# Patient Record
Sex: Female | Born: 1955 | Race: White | Hispanic: No | Smoking: Current some day smoker
Health system: Southern US, Community
[De-identification: ages and names within clinical notes are randomized; demographics above are authoritative.]

## PROBLEM LIST (undated history)

## (undated) DIAGNOSIS — F419 Anxiety disorder, unspecified: Secondary | ICD-10-CM

## (undated) DIAGNOSIS — R011 Cardiac murmur, unspecified: Secondary | ICD-10-CM

## (undated) DIAGNOSIS — J449 Chronic obstructive pulmonary disease, unspecified: Secondary | ICD-10-CM

## (undated) DIAGNOSIS — I739 Peripheral vascular disease, unspecified: Secondary | ICD-10-CM

## (undated) DIAGNOSIS — IMO0001 Reserved for inherently not codable concepts without codable children: Secondary | ICD-10-CM

## (undated) DIAGNOSIS — T7840XA Allergy, unspecified, initial encounter: Secondary | ICD-10-CM

## (undated) DIAGNOSIS — I10 Essential (primary) hypertension: Secondary | ICD-10-CM

## (undated) HISTORY — PX: CHOLECYSTECTOMY: SHX55

## (undated) HISTORY — PX: BREAST BIOPSY: SHX20

## (undated) HISTORY — DX: Allergy, unspecified, initial encounter: T78.40XA

## (undated) HISTORY — PX: TUBAL LIGATION: SHX77

## (undated) HISTORY — DX: Essential (primary) hypertension: I10

## (undated) HISTORY — DX: Chronic obstructive pulmonary disease, unspecified: J44.9

## (undated) HISTORY — PX: TONSILLECTOMY: SUR1361

---

## 1988-08-07 HISTORY — PX: ABDOMINAL HYSTERECTOMY: SHX81

## 2009-08-16 ENCOUNTER — Ambulatory Visit: Payer: Self-pay | Admitting: Gastroenterology

## 2009-10-08 ENCOUNTER — Ambulatory Visit: Payer: Self-pay | Admitting: Family Medicine

## 2011-09-18 ENCOUNTER — Ambulatory Visit: Payer: Self-pay | Admitting: Family Medicine

## 2012-11-13 ENCOUNTER — Ambulatory Visit: Payer: Self-pay | Admitting: Family Medicine

## 2013-11-14 ENCOUNTER — Ambulatory Visit: Payer: Self-pay | Admitting: Internal Medicine

## 2013-11-14 LAB — CBC CANCER CENTER
BASOS ABS: 0.1 x10 3/mm (ref 0.0–0.1)
BASOS PCT: 0.6 %
EOS ABS: 0.1 x10 3/mm (ref 0.0–0.7)
EOS PCT: 0.4 %
HCT: 56.5 % — ABNORMAL HIGH (ref 35.0–47.0)
HGB: 18.6 g/dL — ABNORMAL HIGH (ref 12.0–16.0)
Lymphocyte #: 2.3 x10 3/mm (ref 1.0–3.6)
Lymphocyte %: 16.5 %
MCH: 33.2 pg (ref 26.0–34.0)
MCHC: 33 g/dL (ref 32.0–36.0)
MCV: 101 fL — ABNORMAL HIGH (ref 80–100)
Monocyte #: 1.1 x10 3/mm — ABNORMAL HIGH (ref 0.2–0.9)
Monocyte %: 8.1 %
Neutrophil #: 10.3 x10 3/mm — ABNORMAL HIGH (ref 1.4–6.5)
Neutrophil %: 74.4 %
PLATELETS: 258 x10 3/mm (ref 150–440)
RBC: 5.61 10*6/uL — ABNORMAL HIGH (ref 3.80–5.20)
RDW: 12.9 % (ref 11.5–14.5)
WBC: 13.8 x10 3/mm — ABNORMAL HIGH (ref 3.6–11.0)

## 2013-11-14 LAB — IRON AND TIBC
IRON: 59 ug/dL (ref 50–170)
Iron Bind.Cap.(Total): 399 ug/dL (ref 250–450)
Iron Saturation: 15 %
UNBOUND IRON-BIND. CAP.: 340 ug/dL

## 2013-11-14 LAB — FERRITIN: Ferritin (ARMC): 49 ng/mL (ref 8–388)

## 2013-11-17 LAB — CANCER CENTER HEMATOCRIT: HCT: 47.8 % — ABNORMAL HIGH (ref 35.0–47.0)

## 2013-11-19 ENCOUNTER — Ambulatory Visit: Payer: Self-pay | Admitting: Internal Medicine

## 2013-11-21 LAB — CANCER CENTER HEMATOCRIT: HCT: 42.6 % (ref 35.0–47.0)

## 2013-12-05 ENCOUNTER — Ambulatory Visit: Payer: Self-pay | Admitting: Internal Medicine

## 2013-12-05 LAB — CANCER CENTER HEMATOCRIT: HCT: 43.9 % (ref 35.0–47.0)

## 2013-12-19 LAB — CANCER CENTER HEMATOCRIT: HCT: 42.2 % (ref 35.0–47.0)

## 2014-01-02 LAB — CANCER CENTER HEMATOCRIT: HCT: 44.5 % (ref 35.0–47.0)

## 2014-01-05 ENCOUNTER — Ambulatory Visit: Payer: Self-pay | Admitting: Internal Medicine

## 2014-01-16 LAB — CANCER CENTER HEMATOCRIT: HCT: 39.7 % (ref 35.0–47.0)

## 2014-01-30 LAB — CANCER CENTER HEMATOCRIT: HCT: 41.1 % (ref 35.0–47.0)

## 2014-02-04 ENCOUNTER — Ambulatory Visit: Payer: Self-pay | Admitting: Internal Medicine

## 2014-02-13 LAB — CBC CANCER CENTER
BASOS ABS: 0.1 x10 3/mm (ref 0.0–0.1)
Basophil %: 1 %
EOS PCT: 2.5 %
Eosinophil #: 0.2 x10 3/mm (ref 0.0–0.7)
HCT: 41.4 % (ref 35.0–47.0)
HGB: 13.7 g/dL (ref 12.0–16.0)
Lymphocyte #: 1.7 x10 3/mm (ref 1.0–3.6)
Lymphocyte %: 20.3 %
MCH: 29.1 pg (ref 26.0–34.0)
MCHC: 33 g/dL (ref 32.0–36.0)
MCV: 88 fL (ref 80–100)
Monocyte #: 0.8 x10 3/mm (ref 0.2–0.9)
Monocyte %: 8.7 %
Neutrophil #: 5.8 x10 3/mm (ref 1.4–6.5)
Neutrophil %: 67.5 %
PLATELETS: 412 x10 3/mm (ref 150–440)
RBC: 4.69 10*6/uL (ref 3.80–5.20)
RDW: 15.4 % — ABNORMAL HIGH (ref 11.5–14.5)
WBC: 8.6 x10 3/mm (ref 3.6–11.0)

## 2014-03-07 ENCOUNTER — Ambulatory Visit: Payer: Self-pay | Admitting: Internal Medicine

## 2014-03-27 LAB — CANCER CENTER HEMATOCRIT: HCT: 42.9 % (ref 35.0–47.0)

## 2014-04-07 ENCOUNTER — Ambulatory Visit: Payer: Self-pay | Admitting: Internal Medicine

## 2014-05-08 ENCOUNTER — Ambulatory Visit: Payer: Self-pay | Admitting: Internal Medicine

## 2014-05-08 LAB — CANCER CENTER HEMATOCRIT: HCT: 44.6 % (ref 35.0–47.0)

## 2014-06-07 ENCOUNTER — Ambulatory Visit: Payer: Self-pay | Admitting: Internal Medicine

## 2014-06-19 LAB — CANCER CENTER HEMATOCRIT: HCT: 40.4 % (ref 35.0–47.0)

## 2014-07-07 ENCOUNTER — Ambulatory Visit: Payer: Self-pay | Admitting: Internal Medicine

## 2014-07-24 LAB — CANCER CENTER HEMATOCRIT: HCT: 42.1 % (ref 35.0–47.0)

## 2014-08-07 ENCOUNTER — Ambulatory Visit: Payer: Self-pay | Admitting: Internal Medicine

## 2014-09-04 LAB — CANCER CENTER HEMATOCRIT: HCT: 43.3 % (ref 35.0–47.0)

## 2014-09-07 ENCOUNTER — Ambulatory Visit: Payer: Self-pay | Admitting: Internal Medicine

## 2014-10-26 ENCOUNTER — Ambulatory Visit
Admit: 2014-10-26 | Disposition: A | Discharge status: Home / Self Care | Payer: Self-pay | Attending: Internal Medicine | Admitting: Internal Medicine

## 2014-10-29 ENCOUNTER — Ambulatory Visit: Payer: Self-pay | Admitting: Family Medicine

## 2014-11-06 ENCOUNTER — Ambulatory Visit
Admit: 2014-11-06 | Disposition: A | Discharge status: Home / Self Care | Payer: Self-pay | Attending: Internal Medicine | Admitting: Internal Medicine

## 2014-12-21 ENCOUNTER — Inpatient Hospital Stay: Payer: BC Managed Care – PPO | Attending: Internal Medicine

## 2014-12-21 ENCOUNTER — Inpatient Hospital Stay: Payer: BC Managed Care – PPO

## 2014-12-21 ENCOUNTER — Other Ambulatory Visit: Payer: Self-pay

## 2014-12-21 DIAGNOSIS — D751 Secondary polycythemia: Secondary | ICD-10-CM

## 2014-12-21 LAB — CBC WITH DIFFERENTIAL/PLATELET
Basophils Absolute: 0.1 10*3/uL (ref 0–0.1)
Basophils Relative: 1 %
Eosinophils Absolute: 0.2 10*3/uL (ref 0–0.7)
Eosinophils Relative: 3 %
HEMATOCRIT: 43.7 % (ref 35.0–47.0)
Hemoglobin: 14.5 g/dL (ref 12.0–16.0)
LYMPHS ABS: 1.8 10*3/uL (ref 1.0–3.6)
LYMPHS PCT: 19 %
MCH: 28.8 pg (ref 26.0–34.0)
MCHC: 33.1 g/dL (ref 32.0–36.0)
MCV: 87.1 fL (ref 80.0–100.0)
MONO ABS: 0.8 10*3/uL (ref 0.2–0.9)
Monocytes Relative: 9 %
Neutro Abs: 6.7 10*3/uL — ABNORMAL HIGH (ref 1.4–6.5)
Neutrophils Relative %: 68 %
Platelets: 306 10*3/uL (ref 150–440)
RBC: 5.02 MIL/uL (ref 3.80–5.20)
RDW: 18.4 % — ABNORMAL HIGH (ref 11.5–14.5)
WBC: 9.7 10*3/uL (ref 3.6–11.0)

## 2015-02-12 ENCOUNTER — Other Ambulatory Visit: Payer: Self-pay

## 2015-02-12 DIAGNOSIS — D751 Secondary polycythemia: Secondary | ICD-10-CM | POA: Insufficient documentation

## 2015-02-13 ENCOUNTER — Other Ambulatory Visit: Payer: Self-pay

## 2015-02-15 ENCOUNTER — Inpatient Hospital Stay: Payer: BC Managed Care – PPO

## 2015-02-15 ENCOUNTER — Inpatient Hospital Stay: Payer: BC Managed Care – PPO | Attending: Internal Medicine

## 2015-02-15 DIAGNOSIS — D751 Secondary polycythemia: Secondary | ICD-10-CM | POA: Insufficient documentation

## 2015-02-15 LAB — HEMATOCRIT: HEMATOCRIT: 41.4 % (ref 35.0–47.0)

## 2015-04-13 ENCOUNTER — Inpatient Hospital Stay: Payer: BC Managed Care – PPO

## 2015-04-13 ENCOUNTER — Inpatient Hospital Stay: Payer: BC Managed Care – PPO | Attending: Internal Medicine

## 2015-06-06 ENCOUNTER — Other Ambulatory Visit: Payer: Self-pay | Admitting: *Deleted

## 2015-06-06 DIAGNOSIS — D751 Secondary polycythemia: Secondary | ICD-10-CM

## 2015-06-07 ENCOUNTER — Inpatient Hospital Stay: Payer: BC Managed Care – PPO | Attending: Internal Medicine

## 2015-06-07 ENCOUNTER — Inpatient Hospital Stay: Payer: BC Managed Care – PPO

## 2015-06-07 DIAGNOSIS — D751 Secondary polycythemia: Secondary | ICD-10-CM | POA: Insufficient documentation

## 2015-06-07 LAB — HEMATOCRIT: HEMATOCRIT: 45.9 % (ref 35.0–47.0)

## 2015-08-02 ENCOUNTER — Other Ambulatory Visit: Payer: Self-pay

## 2015-08-04 ENCOUNTER — Inpatient Hospital Stay: Payer: BC Managed Care – PPO | Attending: Internal Medicine

## 2015-08-04 ENCOUNTER — Inpatient Hospital Stay: Payer: BC Managed Care – PPO

## 2015-08-04 VITALS — BP 150/71 | HR 90 | Temp 97.0°F | Resp 20

## 2015-08-04 DIAGNOSIS — D751 Secondary polycythemia: Secondary | ICD-10-CM | POA: Insufficient documentation

## 2015-08-04 LAB — HEMATOCRIT: HCT: 44 % (ref 35.0–47.0)

## 2015-09-14 ENCOUNTER — Other Ambulatory Visit: Payer: Self-pay | Admitting: Family Medicine

## 2015-09-14 DIAGNOSIS — Z1231 Encounter for screening mammogram for malignant neoplasm of breast: Secondary | ICD-10-CM

## 2015-09-22 ENCOUNTER — Ambulatory Visit
Admission: RE | Admit: 2015-09-22 | Discharge: 2015-09-22 | Disposition: A | Discharge status: Home / Self Care | Payer: BC Managed Care – PPO | Source: Ambulatory Visit | Attending: Family Medicine | Admitting: Family Medicine

## 2015-09-22 DIAGNOSIS — Z1231 Encounter for screening mammogram for malignant neoplasm of breast: Secondary | ICD-10-CM

## 2015-09-27 ENCOUNTER — Encounter: Payer: Self-pay | Admitting: Internal Medicine

## 2015-09-27 ENCOUNTER — Ambulatory Visit: Payer: Self-pay | Admitting: Internal Medicine

## 2015-09-27 ENCOUNTER — Inpatient Hospital Stay: Payer: BC Managed Care – PPO | Attending: Internal Medicine | Admitting: Internal Medicine

## 2015-09-27 ENCOUNTER — Inpatient Hospital Stay: Payer: BC Managed Care – PPO

## 2015-09-27 VITALS — BP 155/77 | HR 88 | Temp 98.1°F | Resp 18 | Ht 59.0 in | Wt 116.8 lb

## 2015-09-27 DIAGNOSIS — D751 Secondary polycythemia: Secondary | ICD-10-CM | POA: Diagnosis not present

## 2015-09-27 DIAGNOSIS — I10 Essential (primary) hypertension: Secondary | ICD-10-CM | POA: Diagnosis not present

## 2015-09-27 DIAGNOSIS — Z79899 Other long term (current) drug therapy: Secondary | ICD-10-CM | POA: Insufficient documentation

## 2015-09-27 DIAGNOSIS — Z803 Family history of malignant neoplasm of breast: Secondary | ICD-10-CM | POA: Insufficient documentation

## 2015-09-27 DIAGNOSIS — Z7982 Long term (current) use of aspirin: Secondary | ICD-10-CM | POA: Diagnosis not present

## 2015-09-27 DIAGNOSIS — F17210 Nicotine dependence, cigarettes, uncomplicated: Secondary | ICD-10-CM | POA: Insufficient documentation

## 2015-09-27 DIAGNOSIS — J449 Chronic obstructive pulmonary disease, unspecified: Secondary | ICD-10-CM | POA: Diagnosis not present

## 2015-09-27 LAB — CBC WITH DIFFERENTIAL/PLATELET
Basophils Absolute: 0.1 10*3/uL (ref 0–0.1)
Basophils Relative: 1 %
EOS PCT: 1 %
Eosinophils Absolute: 0.1 10*3/uL (ref 0–0.7)
HEMATOCRIT: 43.8 % (ref 35.0–47.0)
Hemoglobin: 15 g/dL (ref 12.0–16.0)
LYMPHS PCT: 19 %
Lymphs Abs: 1.7 10*3/uL (ref 1.0–3.6)
MCH: 30 pg (ref 26.0–34.0)
MCHC: 34.2 g/dL (ref 32.0–36.0)
MCV: 87.8 fL (ref 80.0–100.0)
MONO ABS: 1 10*3/uL — AB (ref 0.2–0.9)
MONOS PCT: 11 %
NEUTROS ABS: 6.2 10*3/uL (ref 1.4–6.5)
Neutrophils Relative %: 68 %
PLATELETS: 292 10*3/uL (ref 150–440)
RBC: 4.99 MIL/uL (ref 3.80–5.20)
RDW: 17.5 % — AB (ref 11.5–14.5)
WBC: 9 10*3/uL (ref 3.6–11.0)

## 2015-09-27 NOTE — Progress Notes (Signed)
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 S/p Phlebotomy [2015]  # Smoking/ COPD  INTERVAL HISTORY:  This is my first interaction with the patient since I joined the practice September 2016. I reviewed the patient's prior charts/pertinent labs in detail; findings are summarized above.   Patient has just retired from [employer]. She is planned to quit smoking she has just started on Wellbutrin. She is chronic shortness of breath or chronic cough and no hemoptysis.  She denies any headaches or vision changes. No new strokes.   REVIEW OF SYSTEMS:  A complete 10 point review of system is done which is negative except mentioned above/history of present illness.   PAST MEDICAL HISTORY :  Past Medical History  Diagnosis Date  . COPD (chronic obstructive pulmonary disease) (HCC)   . Hypertension   . Allergy     PAST SURGICAL HISTORY :   Past Surgical History  Procedure Laterality Date  . Breast biopsy Left     neg  . Abdominal hysterectomy  1990    FAMILY HISTORY :   Family History  Problem Relation Age of Onset  . Breast cancer Mother 48  . Stroke Father     SOCIAL HISTORY:   Social History  Substance Use Topics  . Smoking status: Current Some Day Smoker -- 0.50 packs/day for 30 years    Types: Cigarettes  . Smokeless tobacco: None  . Alcohol Use: 4.2 oz/week    7 Glasses of wine per week    ALLERGIES:  has No Known Allergies.  MEDICATIONS:  Current Outpatient Prescriptions  Medication Sig Dispense Refill  . atorvastatin (LIPITOR) 40 MG tablet Take by mouth.    Marland Kitchen buPROPion (WELLBUTRIN XL) 150 MG 24 hr tablet Take by mouth.    Marland Kitchen aspirin 81 MG EC tablet     . enalapril (VASOTEC) 2.5 MG tablet TAKE 1 TABLET (2.5 MG TOTAL) BY MOUTH ONCE DAILY.  99  . hydrochlorothiazide (HYDRODIURIL) 25 MG tablet TAKE 1  TABLET (25 MG TOTAL) BY MOUTH ONCE DAILY.  99  . PROAIR HFA 108 (90 Base) MCG/ACT inhaler 2 puffs every 4 (four) hours as needed. for wheezing  11  . SPIRIVA HANDIHALER 18 MCG inhalation capsule      No current facility-administered medications for this visit.    PHYSICAL EXAMINATION:   BP 155/77 mmHg  Pulse 88  Temp(Src) 98.1 F (36.7 C) (Tympanic)  Resp 18  Ht  (1.499 m)  Wt 116 lb 13.5 oz (53 kg)  BMI 23.59 kg/m2  Filed Weights   09/27/15 1342  Weight: 116 lb 13.5 oz (53 kg)    GENERAL:moderately built moderately nourished.Alert, no distress and comfortable. Smells nicotine. EYES: no pallor or icterus OROPHARYNX: no thrush or ulceration; poor dentition.  NECK: supple, no masses felt LYMPH:  no palpable lymphadenopathy in the cervical, axillary or inguinal regions LUNGS: decreased air entry;   No wheeze or crackles HEART/CVS: regular rate & rhythm and no murmurs; No lower extremity edema ABDOMEN:abdomen soft, non-tender and normal bowel sounds Musculoskeletal:no cyanosis of digits and no clubbing  PSYCH: alert & oriented x 3 with fluent speech NEURO: no focal motor/sensory deficits SKIN:  no rashes or significant lesions  LABORATORY DATA:   No results found for: SPEP, UPEP  Lab Results  Component Value Date   WBC 9.0 09/27/2015  NEUTROABS 6.2 09/27/2015   HGB 15.0 09/27/2015   HCT 43.8 09/27/2015   MCV 87.8 09/27/2015   PLT 292 09/27/2015      Chemistry   No results found for: NA, K, CL, CO2, BUN, CREATININE, GLU No results found for: CALCIUM, ALKPHOS, AST, ALT, BILITOT      ASSESSMENT & PLAN:   # secondary 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 Hematocrit today is 43.8 hemoglobin is 15. Patient will continue aspirin once a day. Phlebotomy if HCT > 50/or if patient is symptomatic.   # chronic smoker discussed about quitting smoking; she is just started Wellbutrin.   # lung cancer screening- this has been discussed by her primary care  physician; she is interested/ she is checking with her insurance- she'll follow-up with her PCP.  # patient follow-up with [place] in one year/CBC/possible phlebotomy at that time.  # 15 minutes face-to-face with the patient discussing the above plan of care; more than 50% of time spent on natural history; counseling and coordination.     Earna Coder, MD 09/27/2015 1:54 PM

## 2015-09-29 ENCOUNTER — Other Ambulatory Visit: Payer: Self-pay | Admitting: Vascular Surgery

## 2015-09-29 DIAGNOSIS — I6523 Occlusion and stenosis of bilateral carotid arteries: Secondary | ICD-10-CM

## 2015-10-01 ENCOUNTER — Ambulatory Visit
Admission: RE | Admit: 2015-10-01 | Discharge: 2015-10-01 | Disposition: A | Discharge status: Home / Self Care | Payer: BC Managed Care – PPO | Source: Ambulatory Visit | Attending: Vascular Surgery | Admitting: Vascular Surgery

## 2015-10-01 DIAGNOSIS — I6523 Occlusion and stenosis of bilateral carotid arteries: Secondary | ICD-10-CM | POA: Insufficient documentation

## 2015-10-01 MED ORDER — IOHEXOL 350 MG/ML SOLN
75.0000 mL | Freq: Once | INTRAVENOUS | Status: AC | PRN
  Administered 2015-10-01: 75 mL via INTRAVENOUS

## 2015-10-05 ENCOUNTER — Other Ambulatory Visit: Payer: Self-pay | Admitting: Vascular Surgery

## 2015-10-07 ENCOUNTER — Encounter
Admission: RE | Admit: 2015-10-07 | Discharge: 2015-10-07 | Disposition: A | Discharge status: Home / Self Care | Payer: BC Managed Care – PPO | Source: Ambulatory Visit | Attending: Vascular Surgery | Admitting: Vascular Surgery

## 2015-10-07 DIAGNOSIS — Z01812 Encounter for preprocedural laboratory examination: Secondary | ICD-10-CM | POA: Diagnosis not present

## 2015-10-07 HISTORY — DX: Anxiety disorder, unspecified: F41.9

## 2015-10-07 HISTORY — DX: Reserved for inherently not codable concepts without codable children: IMO0001

## 2015-10-07 HISTORY — DX: Peripheral vascular disease, unspecified: I73.9

## 2015-10-07 HISTORY — DX: Cardiac murmur, unspecified: R01.1

## 2015-10-07 LAB — PROTIME-INR
INR: 0.96
PROTHROMBIN TIME: 13 s (ref 11.4–15.0)

## 2015-10-07 LAB — SURGICAL PCR SCREEN
MRSA, PCR: NEGATIVE
Staphylococcus aureus: NEGATIVE

## 2015-10-07 LAB — CBC WITH DIFFERENTIAL/PLATELET
BASOS ABS: 0.1 10*3/uL (ref 0–0.1)
BASOS PCT: 1 %
EOS ABS: 0.2 10*3/uL (ref 0–0.7)
Eosinophils Relative: 2 %
HCT: 45.6 % (ref 35.0–47.0)
HEMOGLOBIN: 15.6 g/dL (ref 12.0–16.0)
Lymphocytes Relative: 17 %
Lymphs Abs: 1.5 10*3/uL (ref 1.0–3.6)
MCH: 29.6 pg (ref 26.0–34.0)
MCHC: 34.1 g/dL (ref 32.0–36.0)
MCV: 86.6 fL (ref 80.0–100.0)
MONOS PCT: 9 %
Monocytes Absolute: 0.7 10*3/uL (ref 0.2–0.9)
NEUTROS ABS: 6.2 10*3/uL (ref 1.4–6.5)
NEUTROS PCT: 71 %
Platelets: 339 10*3/uL (ref 150–440)
RBC: 5.26 MIL/uL — ABNORMAL HIGH (ref 3.80–5.20)
RDW: 17.4 % — ABNORMAL HIGH (ref 11.5–14.5)
WBC: 8.7 10*3/uL (ref 3.6–11.0)

## 2015-10-07 LAB — BASIC METABOLIC PANEL
ANION GAP: 11 (ref 5–15)
BUN: 9 mg/dL (ref 6–20)
CALCIUM: 9.5 mg/dL (ref 8.9–10.3)
CO2: 30 mmol/L (ref 22–32)
CREATININE: 0.74 mg/dL (ref 0.44–1.00)
Chloride: 93 mmol/L — ABNORMAL LOW (ref 101–111)
Glucose, Bld: 118 mg/dL — ABNORMAL HIGH (ref 65–99)
Potassium: 3.4 mmol/L — ABNORMAL LOW (ref 3.5–5.1)
SODIUM: 134 mmol/L — AB (ref 135–145)

## 2015-10-07 LAB — TYPE AND SCREEN
ABO/RH(D): A POS
Antibody Screen: NEGATIVE

## 2015-10-07 LAB — APTT: APTT: 24 s (ref 24–36)

## 2015-10-07 LAB — ABO/RH: ABO/RH(D): A POS

## 2015-10-07 NOTE — Patient Instructions (Signed)
  Your procedure is scheduled on: October 14, 2015 (Thursday) Report to Day Surgery.[facility]) Second Floor To find out your arrival time please call [phone] between 1PM - 3PM on October 13, 2015 (Wednesday).  Remember: Instructions that are not followed completely may result in serious medical risk, up to and including death, or upon the discretion of your surgeon and anesthesiologist your surgery may need to be rescheduled.    __X_ 1. Do not eat food or drink liquids after midnight. No gum chewing or hard candies.     __X__ 2. No Alcohol for 24 hours before or after surgery.   ____ 3. Bring all medications with you on the day of surgery if instructed.    __X__ 4. Notify your doctor if there is any change in your medical condition     (cold, fever, infections).     Do not wear jewelry, make-up, hairpins, clips or nail polish.  Do not wear lotions, powders, or perfumes. You may wear deodorant.  Do not shave 48 hours prior to surgery. Men may shave face and neck.  Do not bring valuables to the hospital.    [facility] is not responsible for any belongings or valuables.               Contacts, dentures or bridgework may not be worn into surgery.  Leave your suitcase in the car. After surgery it may be brought to your room.  For patients admitted to the hospital, discharge time is determined by your                treatment team.   Patients discharged the day of surgery will not be allowed to drive home.   Please read over the following fact sheets that you were given:   MRSA Information and Surgical Site Infection Prevention   ____ Take these medicines the morning of surgery with A SIP OF WATER:    1. Enalapril  2.   3.   4.  5.  6.  ____ Fleet Enema (as directed)   __x__ Use CHG Soap as directed  __X__ Use inhalers on the day of surgery (Use ProAir inhaler and Spiriva the morning of surgery)  ____ Stop metformin 2 days prior to surgery    ____ Take 1/2 of usual  insulin dose the night before surgery and none on the morning of surgery.   _X___ Stop Coumadin/Plavix/aspirin on (Do not take Aspirin  the morning of surgery)  _X_ Stop Anti-inflammatories on(NO NSAIDS) Tylenol ok to take for pain if needed   ____ Stop supplements until after surgery.    ____ Bring C-Pap to the hospital.

## 2015-10-13 NOTE — Pre-Procedure Instructions (Signed)
NM myocardial perfusion SPECT multiple (stress and rest) (10/11/2015 1:00 PM) NM myocardial perfusion SPECT multiple (stress and rest) (10/11/2015 1:00 PM)  Impressions    1.Negative ETT  2.Normal left ventricular function  3.Normal wall motion  4.No evidence for scar or ischemia      Dr. Marcina MillardAlexander Paraschos      Clinical History:  60 y.o. year old female  Vitals: Height: 60 inWeight: 111 lb  Cardiac risk factors include:    CAS, Smoking, Hyperlipidemia and HTN       Procedure:  The patient performed treadmill exercise using a Bruce protocol for 5:00   minutes. The exercise test was stopped due to fatigue.Blood pressure   response was hypertensive. The patient developed symptoms other than   fatigue during the procedure; specific symptoms included NECK   PAIN/PRESSURE AND DIZZINESS    Rest HR: 101bpm  Rest BP: 140/2280mmHg  Max HR: 131bpm  Max BP: 200/5972mmHg  Mets: 7.00  % MAX HR: 80%    Stress Test Administered by: Percell BostonINA WALLACE, CMA    ECG Interpretation:  Rest ZOX:WRUEAVECG:normal sinus rhythm, none  Stress WUJ:WJXBJYECG:normal sinus rhythm,   Recovery NWG:NFAOZHECG:normal sinus rhythm  ECG Interpretation:negative, no ECG changes.      Administrations This Visit  technetium Tc1210m sestamibi (CARDIOLITE) injection 14.1 millicurie  Admin Date Action Dose Route Administered By         10/11/2015 Given 14.1 millicurie Intravenous Joshua B Hyler, CNMT          technetium Tc910m sestamibi (CARDIOLITE) injection 33.201 millicurie  Admin Date Action Dose Route Administered By         10/11/2015 Given 33.201 millicurie Intravenous Joshua B Hyler, CNMT                Gated post-stress perfusion imaging was performed 30 minutes after stress.   Rest images were performed 30 minutes after injection.    Gated LV Analysis:   TID:    LVEF= 70%    FINDINGS:  Regional  wall motion:reveals normal myocardial thickening and wall   motion.  The overall quality of the study is good.  Artifacts noted: no  Left ventricular cavity: normal.    Perfusion Analysis:SPECT images demonstrate homogeneous tracer   distribution throughout the myocardium.

## 2015-10-13 NOTE — Pre-Procedure Instructions (Signed)
CLEARED BY DR PARASCHOS LOW RISK 10/13/15

## 2015-10-14 ENCOUNTER — Encounter
Admission: AD | Disposition: A | Discharge status: Home / Self Care | Payer: Self-pay | Source: Ambulatory Visit | Attending: Vascular Surgery

## 2015-10-14 ENCOUNTER — Inpatient Hospital Stay: Payer: BC Managed Care – PPO | Admitting: Anesthesiology

## 2015-10-14 ENCOUNTER — Inpatient Hospital Stay
Admission: AD | Admit: 2015-10-14 | Discharge: 2015-10-15 | DRG: 039 | Disposition: A | Discharge status: Home / Self Care | Payer: BC Managed Care – PPO | Source: Ambulatory Visit | Attending: Vascular Surgery | Admitting: Vascular Surgery

## 2015-10-14 ENCOUNTER — Encounter: Payer: Self-pay | Admitting: *Deleted

## 2015-10-14 DIAGNOSIS — I739 Peripheral vascular disease, unspecified: Secondary | ICD-10-CM | POA: Diagnosis present

## 2015-10-14 DIAGNOSIS — J449 Chronic obstructive pulmonary disease, unspecified: Secondary | ICD-10-CM | POA: Diagnosis present

## 2015-10-14 DIAGNOSIS — I10 Essential (primary) hypertension: Secondary | ICD-10-CM | POA: Diagnosis present

## 2015-10-14 DIAGNOSIS — I6529 Occlusion and stenosis of unspecified carotid artery: Secondary | ICD-10-CM | POA: Diagnosis present

## 2015-10-14 DIAGNOSIS — F419 Anxiety disorder, unspecified: Secondary | ICD-10-CM | POA: Diagnosis present

## 2015-10-14 DIAGNOSIS — I6523 Occlusion and stenosis of bilateral carotid arteries: Secondary | ICD-10-CM | POA: Diagnosis present

## 2015-10-14 HISTORY — PX: ENDARTERECTOMY: SHX5162

## 2015-10-14 SURGERY — ENDARTERECTOMY, CAROTID
Anesthesia: General | Laterality: Left | Wound class: Clean

## 2015-10-14 MED ORDER — NEOSTIGMINE METHYLSULFATE 10 MG/10ML IV SOLN
INTRAVENOUS | Status: DC | PRN
  Administered 2015-10-14: 5 mg via INTRAVENOUS

## 2015-10-14 MED ORDER — BUPROPION HCL ER (XL) 150 MG PO TB24
150.0000 mg | ORAL_TABLET | Freq: Every day | ORAL | Status: DC
  Administered 2015-10-14: 150 mg via ORAL
  Filled 2015-10-14: qty 1

## 2015-10-14 MED ORDER — ONDANSETRON HCL 4 MG/2ML IJ SOLN: 4.0000 mg | Freq: Once | INTRAMUSCULAR | Status: DC | PRN

## 2015-10-14 MED ORDER — NITROGLYCERIN IN D5W 200-5 MCG/ML-% IV SOLN: 5.0000 ug/min | INTRAVENOUS | Status: DC

## 2015-10-14 MED ORDER — NITROGLYCERIN IN D5W 200-5 MCG/ML-% IV SOLN
INTRAVENOUS | Status: AC
  Filled 2015-10-14: qty 250

## 2015-10-14 MED ORDER — CEFAZOLIN SODIUM 1 G IJ SOLR
INTRAMUSCULAR | Status: AC
  Filled 2015-10-14: qty 10

## 2015-10-14 MED ORDER — PROPOFOL 10 MG/ML IV BOLUS
INTRAVENOUS | Status: DC | PRN
  Administered 2015-10-14: 100 mg via INTRAVENOUS

## 2015-10-14 MED ORDER — FLUTICASONE PROPIONATE 50 MCG/ACT NA SUSP
2.0000 | Freq: Every day | NASAL | Status: DC
  Administered 2015-10-14 – 2015-10-15 (×2): 2 via NASAL
  Filled 2015-10-14: qty 16

## 2015-10-14 MED ORDER — HYDRALAZINE HCL 20 MG/ML IJ SOLN: 5.0000 mg | INTRAMUSCULAR | Status: DC | PRN

## 2015-10-14 MED ORDER — ASPIRIN EC 81 MG PO TBEC
81.0000 mg | DELAYED_RELEASE_TABLET | Freq: Every day | ORAL | Status: DC
  Administered 2015-10-14 – 2015-10-15 (×2): 81 mg via ORAL
  Filled 2015-10-14 (×2): qty 1

## 2015-10-14 MED ORDER — METOPROLOL TARTRATE 1 MG/ML IV SOLN
2.0000 mg | INTRAVENOUS | Status: DC | PRN
Start: 2015-10-14 — End: 2015-10-15

## 2015-10-14 MED ORDER — SODIUM CHLORIDE 0.9 % IV SOLN
10000.0000 ug | INTRAVENOUS | Status: DC | PRN
  Administered 2015-10-14: 20 ug/min via INTRAVENOUS

## 2015-10-14 MED ORDER — LIDOCAINE HCL (PF) 1 % IJ SOLN
INTRAMUSCULAR | Status: AC
  Filled 2015-10-14: qty 30

## 2015-10-14 MED ORDER — ACETAMINOPHEN 650 MG RE SUPP: 325.0000 mg | RECTAL | Status: DC | PRN

## 2015-10-14 MED ORDER — LACTATED RINGERS IV SOLN
Freq: Once | INTRAVENOUS | Status: AC
  Administered 2015-10-14: 11:00:00 via INTRAVENOUS

## 2015-10-14 MED ORDER — HYDROMORPHONE HCL 1 MG/ML IJ SOLN
INTRAMUSCULAR | Status: DC | PRN
  Administered 2015-10-14: 0.5 mg via INTRAVENOUS

## 2015-10-14 MED ORDER — ROCURONIUM BROMIDE 100 MG/10ML IV SOLN
INTRAVENOUS | Status: DC | PRN
  Administered 2015-10-14 (×2): 10 mg via INTRAVENOUS
  Administered 2015-10-14: 40 mg via INTRAVENOUS

## 2015-10-14 MED ORDER — LIDOCAINE HCL (PF) 4 % IJ SOLN
INTRAMUSCULAR | Status: DC | PRN
  Administered 2015-10-14: 2 mL via INTRADERMAL

## 2015-10-14 MED ORDER — DEXTROSE 5 % IV SOLN
1.5000 g | Freq: Two times a day (BID) | INTRAVENOUS | Status: AC
  Administered 2015-10-14 – 2015-10-15 (×2): 1.5 g via INTRAVENOUS
  Filled 2015-10-14 (×2): qty 1.5

## 2015-10-14 MED ORDER — FENTANYL CITRATE (PF) 100 MCG/2ML IJ SOLN: 25.0000 ug | INTRAMUSCULAR | Status: DC | PRN

## 2015-10-14 MED ORDER — CEFAZOLIN SODIUM-DEXTROSE 2-3 GM-% IV SOLR
INTRAVENOUS | Status: AC
  Filled 2015-10-14: qty 50

## 2015-10-14 MED ORDER — DOPAMINE-DEXTROSE 3.2-5 MG/ML-% IV SOLN: 3.0000 ug/kg/min | INTRAVENOUS | Status: DC

## 2015-10-14 MED ORDER — LABETALOL HCL 5 MG/ML IV SOLN
INTRAVENOUS | Status: AC
  Filled 2015-10-14: qty 4

## 2015-10-14 MED ORDER — SODIUM CHLORIDE 0.9 % IV SOLN
INTRAVENOUS | Status: DC | PRN
  Administered 2015-10-14: 200 mL

## 2015-10-14 MED ORDER — MAGNESIUM SULFATE 2 GM/50ML IV SOLN
2.0000 g | Freq: Every day | INTRAVENOUS | Status: DC | PRN
  Filled 2015-10-14: qty 50

## 2015-10-14 MED ORDER — PHENYLEPHRINE HCL 10 MG/ML IJ SOLN
INTRAMUSCULAR | Status: DC | PRN
  Administered 2015-10-14 (×3): 100 ug via INTRAVENOUS
  Administered 2015-10-14: 50 ug via INTRAVENOUS

## 2015-10-14 MED ORDER — ESMOLOL HCL 100 MG/10ML IV SOLN
INTRAVENOUS | Status: DC | PRN
  Administered 2015-10-14 (×3): 30 mg via INTRAVENOUS

## 2015-10-14 MED ORDER — LIDOCAINE HCL 1 % IJ SOLN
INTRAMUSCULAR | Status: DC | PRN
  Administered 2015-10-14: 7 mL

## 2015-10-14 MED ORDER — LIDOCAINE HCL (CARDIAC) 20 MG/ML IV SOLN
INTRAVENOUS | Status: DC | PRN
  Administered 2015-10-14: 20 mg via INTRAVENOUS

## 2015-10-14 MED ORDER — ENALAPRIL MALEATE 5 MG PO TABS
2.5000 mg | ORAL_TABLET | Freq: Every day | ORAL | Status: DC
  Administered 2015-10-15: 2.5 mg via ORAL
  Filled 2015-10-14: qty 1

## 2015-10-14 MED ORDER — ESMOLOL HCL-SODIUM CHLORIDE 2000 MG/100ML IV SOLN
25.0000 ug/kg/min | INTRAVENOUS | Status: DC
  Filled 2015-10-14: qty 100

## 2015-10-14 MED ORDER — DOCUSATE SODIUM 100 MG PO CAPS
100.0000 mg | ORAL_CAPSULE | Freq: Every day | ORAL | Status: DC
  Administered 2015-10-15: 100 mg via ORAL
  Filled 2015-10-14: qty 1

## 2015-10-14 MED ORDER — ONDANSETRON HCL 4 MG/2ML IJ SOLN
INTRAMUSCULAR | Status: DC | PRN
  Administered 2015-10-14: 4 mg via INTRAVENOUS

## 2015-10-14 MED ORDER — SODIUM CHLORIDE 0.9 % IV SOLN
INTRAVENOUS | Status: DC
  Administered 2015-10-14: 16:00:00 via INTRAVENOUS

## 2015-10-14 MED ORDER — GUAIFENESIN-DM 100-10 MG/5ML PO SYRP: 15.0000 mL | ORAL_SOLUTION | ORAL | Status: DC | PRN

## 2015-10-14 MED ORDER — FENTANYL CITRATE (PF) 100 MCG/2ML IJ SOLN
INTRAMUSCULAR | Status: DC | PRN
  Administered 2015-10-14 (×2): 100 ug via INTRAVENOUS

## 2015-10-14 MED ORDER — FAMOTIDINE IN NACL 20-0.9 MG/50ML-% IV SOLN
20.0000 mg | Freq: Two times a day (BID) | INTRAVENOUS | Status: DC
  Administered 2015-10-14: 20 mg via INTRAVENOUS
  Filled 2015-10-14 (×3): qty 50

## 2015-10-14 MED ORDER — MORPHINE SULFATE (PF) 2 MG/ML IV SOLN
2.0000 mg | INTRAVENOUS | Status: DC | PRN
  Administered 2015-10-14 (×3): 2 mg via INTRAVENOUS
  Administered 2015-10-15: 4 mg via INTRAVENOUS
  Administered 2015-10-15: 2 mg via INTRAVENOUS
  Filled 2015-10-14: qty 2
  Filled 2015-10-14 (×4): qty 1

## 2015-10-14 MED ORDER — HEPARIN SODIUM (PORCINE) 1000 UNIT/ML IJ SOLN
INTRAMUSCULAR | Status: AC
  Filled 2015-10-14: qty 1

## 2015-10-14 MED ORDER — ALBUTEROL SULFATE (2.5 MG/3ML) 0.083% IN NEBU
3.0000 mL | INHALATION_SOLUTION | RESPIRATORY_TRACT | Status: DC | PRN
  Administered 2015-10-15: 3 mL via RESPIRATORY_TRACT
  Filled 2015-10-14: qty 3

## 2015-10-14 MED ORDER — HYDROCHLOROTHIAZIDE 25 MG PO TABS
25.0000 mg | ORAL_TABLET | Freq: Every day | ORAL | Status: DC
  Administered 2015-10-15: 25 mg via ORAL
  Filled 2015-10-14 (×2): qty 1

## 2015-10-14 MED ORDER — SODIUM CHLORIDE 0.9 % IV SOLN
INTRAVENOUS | Status: DC | PRN
  Administered 2015-10-14: 50 mL via INTRAMUSCULAR

## 2015-10-14 MED ORDER — LACTATED RINGERS IV SOLN
INTRAVENOUS | Status: DC
  Administered 2015-10-14 (×3): via INTRAVENOUS

## 2015-10-14 MED ORDER — MIDAZOLAM HCL 2 MG/2ML IJ SOLN
INTRAMUSCULAR | Status: DC | PRN
  Administered 2015-10-14: 2 mg via INTRAVENOUS

## 2015-10-14 MED ORDER — GLYCOPYRROLATE 0.2 MG/ML IJ SOLN
INTRAMUSCULAR | Status: DC | PRN
  Administered 2015-10-14: .8 mg via INTRAVENOUS

## 2015-10-14 MED ORDER — PHENOL 1.4 % MT LIQD
1.0000 | OROMUCOSAL | Status: DC | PRN
  Filled 2015-10-14: qty 177

## 2015-10-14 MED ORDER — LABETALOL HCL 5 MG/ML IV SOLN
INTRAVENOUS | Status: DC | PRN
  Administered 2015-10-14: 10 mg via INTRAVENOUS

## 2015-10-14 MED ORDER — HEPARIN SODIUM (PORCINE) 1000 UNIT/ML IJ SOLN
INTRAMUSCULAR | Status: DC | PRN
  Administered 2015-10-14: 5000 [IU] via INTRAVENOUS

## 2015-10-14 MED ORDER — TIOTROPIUM BROMIDE MONOHYDRATE 18 MCG IN CAPS
18.0000 ug | ORAL_CAPSULE | Freq: Every day | RESPIRATORY_TRACT | Status: DC
  Administered 2015-10-14 – 2015-10-15 (×2): 18 ug via RESPIRATORY_TRACT
  Filled 2015-10-14: qty 5

## 2015-10-14 MED ORDER — LABETALOL HCL 5 MG/ML IV SOLN: 10.0000 mg | INTRAVENOUS | Status: DC | PRN

## 2015-10-14 MED ORDER — CLOPIDOGREL BISULFATE 75 MG PO TABS
75.0000 mg | ORAL_TABLET | Freq: Every day | ORAL | Status: DC
  Administered 2015-10-15: 75 mg via ORAL
  Filled 2015-10-14: qty 1

## 2015-10-14 MED ORDER — LACTATED RINGERS IV SOLN
INTRAVENOUS | Status: DC | PRN
  Administered 2015-10-14: 12:00:00 via INTRAVENOUS

## 2015-10-14 MED ORDER — SODIUM CHLORIDE 0.9 % IV SOLN: 500.0000 mL | Freq: Once | INTRAVENOUS | Status: DC | PRN

## 2015-10-14 MED ORDER — FAMOTIDINE 20 MG PO TABS
ORAL_TABLET | ORAL | Status: AC
  Administered 2015-10-14: 20 mg via ORAL
  Filled 2015-10-14: qty 1

## 2015-10-14 MED ORDER — FAMOTIDINE 20 MG PO TABS
20.0000 mg | ORAL_TABLET | Freq: Once | ORAL | Status: AC
  Administered 2015-10-14: 20 mg via ORAL

## 2015-10-14 MED ORDER — CEFAZOLIN SODIUM-DEXTROSE 2-3 GM-% IV SOLR
2.0000 g | INTRAVENOUS | Status: AC
  Administered 2015-10-14: 2 g via INTRAVENOUS

## 2015-10-14 MED ORDER — OXYCODONE-ACETAMINOPHEN 5-325 MG PO TABS
1.0000 | ORAL_TABLET | ORAL | Status: DC | PRN
  Administered 2015-10-15: 1 via ORAL
  Filled 2015-10-14: qty 2
  Filled 2015-10-14: qty 1

## 2015-10-14 MED ORDER — ACETAMINOPHEN 325 MG PO TABS: 325.0000 mg | ORAL_TABLET | ORAL | Status: DC | PRN

## 2015-10-14 MED ORDER — ATORVASTATIN CALCIUM 20 MG PO TABS
20.0000 mg | ORAL_TABLET | Freq: Every day | ORAL | Status: DC
  Administered 2015-10-14: 20 mg via ORAL
  Filled 2015-10-14: qty 1

## 2015-10-14 MED ORDER — POTASSIUM CHLORIDE CRYS ER 20 MEQ PO TBCR: 20.0000 meq | EXTENDED_RELEASE_TABLET | Freq: Every day | ORAL | Status: DC | PRN

## 2015-10-14 MED ORDER — SODIUM CHLORIDE FLUSH 0.9 % IV SOLN
INTRAVENOUS | Status: AC
  Filled 2015-10-14: qty 3

## 2015-10-14 MED ORDER — ALUM & MAG HYDROXIDE-SIMETH 200-200-20 MG/5ML PO SUSP: 15.0000 mL | ORAL | Status: DC | PRN

## 2015-10-14 MED ORDER — ONDANSETRON HCL 4 MG/2ML IJ SOLN
4.0000 mg | Freq: Four times a day (QID) | INTRAMUSCULAR | Status: DC | PRN
Start: 2015-10-14 — End: 2015-10-15
  Administered 2015-10-14 – 2015-10-15 (×2): 4 mg via INTRAVENOUS
  Filled 2015-10-14 (×2): qty 2

## 2015-10-14 SURGICAL SUPPLY — 60 items
BAG DECANTER FOR FLEXI CONT (MISCELLANEOUS) ×3 IMPLANT
BLADE SURG 15 STRL LF DISP TIS (BLADE) ×1 IMPLANT
BLADE SURG 15 STRL SS (BLADE) ×2
BLADE SURG SZ11 CARB STEEL (BLADE) ×3 IMPLANT
BOOT SUTURE AID YELLOW STND (SUTURE) ×3 IMPLANT
BRUSH SCRUB 4% CHG (MISCELLANEOUS) ×3 IMPLANT
CANISTER SUCT 1200ML W/VALVE (MISCELLANEOUS) ×3 IMPLANT
CATH TRAY 16F METER LATEX (MISCELLANEOUS) ×3 IMPLANT
DRAPE INCISE IOBAN 66X45 STRL (DRAPES) ×3 IMPLANT
DRAPE LAPAROTOMY 77X122 PED (DRAPES) ×3 IMPLANT
DRAPE SHEET LG 3/4 BI-LAMINATE (DRAPES) ×3 IMPLANT
DRSG TEGADERM 4X4.75 (GAUZE/BANDAGES/DRESSINGS) IMPLANT
DRSG TELFA 3X8 NADH (GAUZE/BANDAGES/DRESSINGS) IMPLANT
DURAPREP 26ML APPLICATOR (WOUND CARE) ×3 IMPLANT
ELECT CAUTERY BLADE 6.4 (BLADE) ×3 IMPLANT
ELECT REM PT RETURN 9FT ADLT (ELECTROSURGICAL) ×3
ELECTRODE REM PT RTRN 9FT ADLT (ELECTROSURGICAL) ×1 IMPLANT
EVICEL 2ML SEALANT HUMAN (Miscellaneous) ×3 IMPLANT
GLOVE BIO SURGEON STRL SZ7 (GLOVE) ×15 IMPLANT
GLOVE INDICATOR 7.5 STRL GRN (GLOVE) ×15 IMPLANT
GOWN STRL REUS W/ TWL LRG LVL3 (GOWN DISPOSABLE) ×2 IMPLANT
GOWN STRL REUS W/ TWL XL LVL3 (GOWN DISPOSABLE) ×2 IMPLANT
GOWN STRL REUS W/TWL LRG LVL3 (GOWN DISPOSABLE) ×4
GOWN STRL REUS W/TWL XL LVL3 (GOWN DISPOSABLE) ×4
HEMOSTAT SURGICEL 2X3 (HEMOSTASIS) ×3 IMPLANT
IV NS 250ML (IV SOLUTION) ×2
IV NS 250ML BAXH (IV SOLUTION) ×1 IMPLANT
KIT RM TURNOVER STRD PROC AR (KITS) ×3 IMPLANT
LABEL OR SOLS (LABEL) ×3 IMPLANT
LIQUID BAND (GAUZE/BANDAGES/DRESSINGS) ×3 IMPLANT
LOOP RED MAXI  1X406MM (MISCELLANEOUS) ×4
LOOP VESSEL MAXI 1X406 RED (MISCELLANEOUS) ×2 IMPLANT
LOOP VESSEL MINI 0.8X406 BLUE (MISCELLANEOUS) ×1 IMPLANT
LOOPS BLUE MINI 0.8X406MM (MISCELLANEOUS) ×2
NEEDLE FILTER BLUNT 18X 1/2SAF (NEEDLE)
NEEDLE FILTER BLUNT 18X1 1/2 (NEEDLE) IMPLANT
NEEDLE HYPO 25X1 1.5 SAFETY (NEEDLE) ×6 IMPLANT
NS IRRIG 1000ML POUR BTL (IV SOLUTION) ×3 IMPLANT
PACK BASIN MAJOR ARMC (MISCELLANEOUS) ×3 IMPLANT
PATCH CAROTID ECM VASC 1X10 (Prosthesis & Implant Heart) ×3 IMPLANT
PENCIL ELECTRO HAND CTR (MISCELLANEOUS) IMPLANT
SHUNT W TPORT 9FR PRUITT F3 (SHUNT) ×3 IMPLANT
SUT MNCRL 4-0 (SUTURE) ×2
SUT MNCRL 4-0 27XMFL (SUTURE) ×1
SUT PROLENE 6 0 BV (SUTURE) ×12 IMPLANT
SUT PROLENE 7 0 BV 1 (SUTURE) ×9 IMPLANT
SUT SILK 2 0 (SUTURE) ×2
SUT SILK 2-0 18XBRD TIE 12 (SUTURE) ×1 IMPLANT
SUT SILK 3 0 (SUTURE) ×2
SUT SILK 3-0 18XBRD TIE 12 (SUTURE) ×1 IMPLANT
SUT SILK 4 0 (SUTURE) ×2
SUT SILK 4-0 18XBRD TIE 12 (SUTURE) ×1 IMPLANT
SUT VIC AB 3-0 SH 27 (SUTURE) ×4
SUT VIC AB 3-0 SH 27X BRD (SUTURE) ×2 IMPLANT
SUTURE MNCRL 4-0 27XMF (SUTURE) ×1 IMPLANT
SYR 20CC LL (SYRINGE) ×3 IMPLANT
SYRINGE 10CC LL (SYRINGE) ×6 IMPLANT
TOWEL OR 17X26 4PK STRL BLUE (TOWEL DISPOSABLE) ×3 IMPLANT
TUBING CONNECTING 10 (TUBING) IMPLANT
TUBING CONNECTING 10' (TUBING)

## 2015-10-14 NOTE — Anesthesia Postprocedure Evaluation (Signed)
Anesthesia Post Note  Patient: Antonieta Lovelesseresa D Mattioli  Procedure(s) Performed: Procedure(s) (LRB): ENDARTERECTOMY CAROTID (Left)  Patient location during evaluation: PACU Anesthesia Type: General Level of consciousness: awake and alert Pain management: pain level controlled Vital Signs Assessment: post-procedure vital signs reviewed and stable Respiratory status: spontaneous breathing, nonlabored ventilation, respiratory function stable and patient connected to nasal cannula oxygen Cardiovascular status: blood pressure returned to baseline and stable Postop Assessment: no signs of nausea or vomiting Anesthetic complications: no    Last Vitals:  Filed Vitals:   10/14/15 1645 10/14/15 1700  BP:  111/75  Pulse: 76 74  Temp:    Resp: 17 13    Last Pain:  Filed Vitals:   10/14/15 1714  PainSc: 7                  Kelsy Polack S

## 2015-10-14 NOTE — H&P (Signed)
[facility] History & Physical Update  The patient was interviewed and re-examined.  The patient's previous History and Physical has been reviewed and is unchanged.  There is no change in the plan of care. We plan to proceed with the scheduled procedure.  Barnes Florek, MD  10/14/2015, 11:26 AM

## 2015-10-14 NOTE — Anesthesia Preprocedure Evaluation (Addendum)
Anesthesia Evaluation  Patient identified by MRN, date of birth, ID band Patient awake    Reviewed: Allergy & Precautions, NPO status , Patient's Chart, lab work & pertinent test results  Airway Mallampati: II  TM Distance: >3 FB Neck ROM: Full    Dental  (+) Caps, Chipped   Pulmonary shortness of breath and with exertion, COPD,  COPD inhaler, Current Smoker,    Pulmonary exam normal breath sounds clear to auscultation       Cardiovascular hypertension, Pt. on medications + Peripheral Vascular Disease  Normal cardiovascular exam+ Valvular Problems/Murmurs      Neuro/Psych Anxiety negative neurological ROS     GI/Hepatic negative GI ROS, Neg liver ROS,   Endo/Other  negative endocrine ROS  Renal/GU negative Renal ROS  negative genitourinary   Musculoskeletal negative musculoskeletal ROS (+)   Abdominal Normal abdominal exam  (+)   Peds negative pediatric ROS (+)  Hematology negative hematology ROS (+)   Anesthesia Other Findings   Reproductive/Obstetrics                            Anesthesia Physical Anesthesia Plan  ASA: III  Anesthesia Plan: General   Post-op Pain Management:    Induction: Intravenous  Airway Management Planned: Oral ETT  Additional Equipment: Arterial line  Intra-op Plan:   Post-operative Plan: Extubation in OR  Informed Consent: I have reviewed the patients History and Physical, chart, labs and discussed the procedure including the risks, benefits and alternatives for the proposed anesthesia with the patient or authorized representative who has indicated his/her understanding and acceptance.   Dental advisory given  Plan Discussed with: CRNA and Surgeon  Anesthesia Plan Comments:         Anesthesia Quick Evaluation

## 2015-10-14 NOTE — Anesthesia Procedure Notes (Signed)
Procedure Name: Intubation Performed by: Casey BurkittHOANG, Melodye Swor Pre-anesthesia Checklist: Patient identified, Emergency Drugs available, Patient being monitored, Suction available and Timeout performed Patient Re-evaluated:Patient Re-evaluated prior to inductionOxygen Delivery Method: Circle system utilized Preoxygenation: Pre-oxygenation with 100% oxygen Intubation Type: IV induction Ventilation: Two handed mask ventilation required and Oral airway inserted - appropriate to patient size Laryngoscope Size: Mac and 3 Grade View: Grade I Tube type: Oral Tube size: 7.0 mm Number of attempts: 1 Airway Equipment and Method: Stylet Placement Confirmation: ETT inserted through vocal cords under direct vision,  positive ETCO2 and breath sounds checked- equal and bilateral Secured at: 21 cm Tube secured with: Tape Dental Injury: Teeth and Oropharynx as per pre-operative assessment

## 2015-10-14 NOTE — Progress Notes (Signed)
Patient awake, alert. Resting well after CEA today Neck with minimal swelling, no hematoma BP is a little high.  About 160/60.  If stays up after pain medicine given, would plan to start Esmolol/NTG to lower and keep BP less than 140-150 systolic overnight. Advance diet tomorrow Foley out in the am

## 2015-10-14 NOTE — Transfer of Care (Signed)
Immediate Anesthesia Transfer of Care Note  Patient: Samantha Cervantes  Procedure(s) Performed: Procedure(s): ENDARTERECTOMY CAROTID (Left)  Patient Location: PACU  Anesthesia Type:General  Level of Consciousness: awake, alert  and oriented  Airway & Oxygen Therapy: Patient Spontanous Breathing and Patient connected to face mask oxygen  Post-op Assessment: Report given to RN and Post -op Vital signs reviewed and stable  Post vital signs: Reviewed and stable  Last Vitals:  Filed Vitals:   10/14/15 1042 10/14/15 1500  BP: 160/74 132/81  Pulse: 92 84  Temp: 36.6 C 36.3 C  Resp: 18 14    Complications: No apparent anesthesia complications

## 2015-10-14 NOTE — Op Note (Signed)
VEIN AND VASCULAR SURGERY   OPERATIVE NOTE  PROCEDURE:   1.  Left carotid endarterectomy with CorMatrix arterial patch reconstruction  PRE-OPERATIVE DIAGNOSIS: 1.  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 Moderate to high grade right carotid artery stenosis  POST-OPERATIVE DIAGNOSIS: same as above   SURGEON: Festus Barren, MD  ASSISTANT(S): Raul Del, PA-C  ANESTHESIA: general  ESTIMATED BLOOD LOSS: 50 cc  FINDING(S): 1.  left carotid plaque.  SPECIMEN(S):  Carotid plaque (sent to Pathology)  INDICATIONS:   Samantha Cervantes is a 60 y.o. female who presents with a left carotid stenosis of >75% with previous amaurosis fugax symptoms.  I discussed with the patient the risks, benefits, and alternatives to carotid endarterectomy.  I discussed the differences between carotid stenting and carotid endarterectomy. I discussed the procedural details of carotid endarterectomy with the patient.  The patient is aware that the risks of carotid endarterectomy include but are not limited to: bleeding, infection, stroke, myocardial infarction, death, cranial nerve injuries both temporary and permanent, neck hematoma, possible airway compromise, labile blood pressure post-operatively, cerebral hyperperfusion syndrome, and possible need for additional interventions in the future. The patient is aware of the risks and agrees to proceed forward with the procedure.  DESCRIPTION: After full informed written consent was obtained from the patient, the patient was brought back to the operating room and placed supine upon the operating table.  Prior to induction, the patient received IV antibiotics.  After obtaining adequate anesthesia, the patient was placed into a modified beach chair position with a shoulder roll in place and the patient's neck slightly hyperextended and rotated away from the surgical site.  The patient was prepped in the standard fashion for a carotid  endarterectomy.  I made an incision anterior to the sternocleidomastoid muscle and dissected down through the subcutaneous tissue.  The platysmas was opened with electrocautery.  Then I dissected down to the internal jugular vein and facial vein.  The facial vein is ligated and divided between 2-0 silk ties.  This was dissected posteriorly until I obtained visualization of the common carotid artery.  This was dissected out and then a vessel loop was placed around the common carotid artery.  I then dissected in a periadventitial fashion along the common carotid artery up to the bifurcation.  I then identified the external carotid artery and the superior thyroid artery.  I placed a vessel loop around the superior thyroid artery, and I also dissected out the external carotid artery and placed a vessel loop around it. In the process of this dissection, the hypoglossal nerve was identified and protected from harm.  I then dissected out the internal carotid artery until I identified an area in the internal carotid artery clearly above the stenosis.  I dissected slightly distal to this area, and placed a vessel loop around the artery.  At this point, we gave the patient 5000 units of intravenous heparin.  After this was allowed to circulate for several minutes, I pulled up control on the vessel loops to clamp the internal carotid artery, external carotid artery, superior thyroid artery, and then the common carotid artery.  I then made an arteriotomy in the common carotid artery with a 11 blade, and extended the arteriotomy with a Potts scissor down into the common carotid artery, then I carried the arteriotomy through the bifurcation into the internal carotid artery until I reached an area that was not diseased.  At this point, I took the P[place]itt-[place]  shunt that previously been prepared and I inserted it into the internal carotid artery first, and then into the common carotid artery taking care to flush and de-air prior  to release of control. At this point, I started the endarterectomy in the common carotid artery with a Penfield elevator and carried this dissection down into the common carotid artery circumferentially.  Then I transected the plaque at a segment where it was adherent and transected the plaque with Potts scissors.  I then carried this dissection up into the external carotid artery.  The plaque was extracted by unclamping the external carotid artery and performing an eversion endarterectomy.  The dissection was then carried into the internal carotid artery where a nice feathered end point was created with gentle traction.  I passed the plaque off the field as a specimen. At this point I removed all loose flecks and remaining disease possible.  At this point, I was satisfied that the minimal remaining disease was densely adherent to the wall and wall integrity was intact. The distal endpoint was tacked down with two 7-0 Prolene sutures.  I then fashioned a CorMatrix arterial patch for the artery and sewed it in place with two running stitch of 6-0 Prolene.  I started at the distal endpoint and ran one half the length of the arteriotomy.  I then cut and beveled the patch to an appropriate length to match the arteriotomy.  I started the second 6-0 Prolene at the proximal end point.  The medial suture line was completed and the lateral suture line was run approximately one quarter the length of the arteriotomy.  Prior to completing this patch angioplasty, I removed the shunt first from the internal carotid artery, from which there was excellent backbleeding, and clamped it.  Then I removed the shunt from the common carotid artery, from which there was excellent antegrade bleeding, and then clamped it.  At this point, I allowed the external carotid artery to backbleed, which was excellent.  Then I instilled heparinized saline in this patched artery and then completed the patch angioplasty in the usual fashion.  First, I  released the clamp on the external carotid artery, then I released it on the common carotid artery.  After waiting a few seconds, I then released it on the internal carotid artery. Several minutes of pressure were held and 6-0 Prolene patch sutures were used as need for hemostasis.  At this point, I placed Surgicel and Evicel topical hemostatic agents.  There was no more active bleeding in the surgical site.  The sternocleidomastoid space was closed with three interrupted 3-0 Vicryl sutures. I then reapproximated the platysma muscle with a running stitch of 3-0 Vicryl.  The skin was then closed with a running subcuticular 4-0 Monocryl.  The skin was then cleaned, dried and Dermabond was used to reinforce the skin closure.  The patient awakened and was taken to the recovery room in stable condition, following commands and moving all four extremities without any apparent deficits.    COMPLICATIONS: none  CONDITION: stable  Samantha Cervantes  10/14/2015, 2:31 PM

## 2015-10-15 ENCOUNTER — Encounter: Payer: Self-pay | Admitting: Vascular Surgery

## 2015-10-15 LAB — CBC
HCT: 40.2 % (ref 35.0–47.0)
Hemoglobin: 13.3 g/dL (ref 12.0–16.0)
MCH: 29.9 pg (ref 26.0–34.0)
MCHC: 33 g/dL (ref 32.0–36.0)
MCV: 90.6 fL (ref 80.0–100.0)
PLATELETS: 261 10*3/uL (ref 150–440)
RBC: 4.44 MIL/uL (ref 3.80–5.20)
RDW: 18 % — AB (ref 11.5–14.5)
WBC: 11.5 10*3/uL — AB (ref 3.6–11.0)

## 2015-10-15 LAB — BASIC METABOLIC PANEL
ANION GAP: 8 (ref 5–15)
BUN: 10 mg/dL (ref 6–20)
CALCIUM: 8.4 mg/dL — AB (ref 8.9–10.3)
CO2: 27 mmol/L (ref 22–32)
Chloride: 100 mmol/L — ABNORMAL LOW (ref 101–111)
Creatinine, Ser: 0.73 mg/dL (ref 0.44–1.00)
GLUCOSE: 112 mg/dL — AB (ref 65–99)
POTASSIUM: 4 mmol/L (ref 3.5–5.1)
SODIUM: 135 mmol/L (ref 135–145)

## 2015-10-15 LAB — GLUCOSE, CAPILLARY: GLUCOSE-CAPILLARY: 107 mg/dL — AB (ref 65–99)

## 2015-10-15 MED ORDER — CLOPIDOGREL BISULFATE 75 MG PO TABS: 75.0000 mg | ORAL_TABLET | Freq: Every day | ORAL | Status: AC

## 2015-10-15 MED ORDER — OXYCODONE-ACETAMINOPHEN 5-325 MG PO TABS: 1.0000 | ORAL_TABLET | Freq: Four times a day (QID) | ORAL | Status: DC | PRN

## 2015-10-15 MED ORDER — CLOPIDOGREL BISULFATE 75 MG PO TABS: 75.0000 mg | ORAL_TABLET | Freq: Every day | ORAL | Status: DC

## 2015-10-15 MED ORDER — AEROCHAMBER PLUS FLO-VU SMALL MISC: 1.0000 | Freq: Once | Status: DC

## 2015-10-15 MED ORDER — FAMOTIDINE 20 MG PO TABS
20.0000 mg | ORAL_TABLET | Freq: Two times a day (BID) | ORAL | Status: DC
  Administered 2015-10-15: 20 mg via ORAL
  Filled 2015-10-15: qty 1

## 2015-10-15 MED ORDER — OXYCODONE-ACETAMINOPHEN 5-325 MG PO TABS: 1.0000 | ORAL_TABLET | Freq: Four times a day (QID) | ORAL | Status: AC | PRN

## 2015-10-15 NOTE — Anesthesia Postprocedure Evaluation (Signed)
Anesthesia Post Note  Patient: Samantha Cervantes  Procedure(s) Performed: Procedure(s) (LRB): ENDARTERECTOMY CAROTID (Left)  Patient location during evaluation: ICU Anesthesia Type: General Level of consciousness: awake and alert Pain management: pain level controlled Vital Signs Assessment: post-procedure vital signs reviewed and stable Respiratory status: spontaneous breathing Cardiovascular status: blood pressure returned to baseline Postop Assessment: no headache Anesthetic complications: no    Last Vitals:  Filed Vitals:   10/15/15 0400 10/15/15 0500  BP:  93/55  Pulse:  70  Temp:    Resp: 18 15    Last Pain:  Filed Vitals:   10/15/15 0642  PainSc: 9                  Kayela Humphres,  Sharlet SalinaBenjamin P

## 2015-10-15 NOTE — Discharge Instructions (Signed)
You may shower as of Sunday.

## 2015-10-15 NOTE — Addendum Note (Signed)
Addendum  created 10/15/15 0725 by Mathews ArgyleBenjamin Kaleeyah Cuffie, CRNA   Modules edited: Clinical Notes   Clinical Notes:  File: [id number]

## 2015-10-15 NOTE — Progress Notes (Signed)
Once nausea subsided pt felt well enough to walk and eat.  She thinks morphine taken during night was making her feel sick. UP in halls with minimal assist with lines and cords.  Tolerated well.  Mild dyspnea on return.  Sats 90 at lowest but recover to 95-96% [facility] rest[facility] Discharge instructions: incision care, activity limitations, symptoms to report, new medications and return appointments discussed. Teach back understanding of instructions is good. Daughter and sisters with pt at discharge.

## 2015-10-15 NOTE — Progress Notes (Signed)
[facility]  Daily Progress Note   Subjective  - 1 Day Post-Op  Not feeling very well. Nausea this am.  Sats dropped with minimal activity.  Has a headache   Objective Filed Vitals:   10/15/15 0500 10/15/15 0700 10/15/15 0730 10/15/15 0800  BP: 93/55 109/53  154/71  Pulse: 70 80 87 87  Temp:  97.7 F (36.5 C)    TempSrc:  Oral    Resp: 15 15 18 15   Height:      Weight:      SpO2:  98% 98%     Intake/Output Summary (Last 24 hours) at 10/15/15 0840 Last data filed at 10/15/15 0700  Gross per 24 hour  Intake   2940 ml  Output    451 ml  Net   2489 ml    PULM  CTAB CV  RRR VASC  Neck C/D/I with minimal swelling NEURO exam normal  Laboratory CBC    Component Value Date/Time   WBC 11.5* 10/15/2015 0357   WBC 8.6 02/13/2014 1006   HGB 13.3 10/15/2015 0357   HGB 13.7 02/13/2014 1006   HCT 40.2 10/15/2015 0357   HCT 43.3 09/04/2014 1013   PLT 261 10/15/2015 0357   PLT 412 02/13/2014 1006    BMET    Component Value Date/Time   NA 135 10/15/2015 0357   K 4.0 10/15/2015 0357   CL 100* 10/15/2015 0357   CO2 27 10/15/2015 0357   GLUCOSE 112* 10/15/2015 0357   BUN 10 10/15/2015 0357   CREATININE 0.73 10/15/2015 0357   CALCIUM 8.4* 10/15/2015 0357   GFRNONAA >60 10/15/2015 0357   GFRAA >60 10/15/2015 0357    Assessment/Planning: POD #1 s/p left CEA   Some reperfusion headache, some nausea, and some hypoxia with activity this am.  Will saline lock and try to increase activity and work on pulmonary toilet.  Likely out to floor later today and maybe discharge tomorrow unless she is feeling much better later today    Juliette Standre  10/15/2015, 8:40 AM

## 2015-10-16 ENCOUNTER — Emergency Department
Admission: EM | Admit: 2015-10-16 | Discharge: 2015-10-17 | Payer: BC Managed Care – PPO | Attending: Emergency Medicine | Admitting: Emergency Medicine

## 2015-10-16 ENCOUNTER — Encounter: Payer: Self-pay | Admitting: Emergency Medicine

## 2015-10-16 ENCOUNTER — Emergency Department: Payer: BC Managed Care – PPO

## 2015-10-16 DIAGNOSIS — Z79899 Other long term (current) drug therapy: Secondary | ICD-10-CM | POA: Insufficient documentation

## 2015-10-16 DIAGNOSIS — R51 Headache: Secondary | ICD-10-CM | POA: Diagnosis present

## 2015-10-16 DIAGNOSIS — Z7982 Long term (current) use of aspirin: Secondary | ICD-10-CM | POA: Diagnosis not present

## 2015-10-16 DIAGNOSIS — Z7902 Long term (current) use of antithrombotics/antiplatelets: Secondary | ICD-10-CM | POA: Insufficient documentation

## 2015-10-16 DIAGNOSIS — I10 Essential (primary) hypertension: Secondary | ICD-10-CM | POA: Insufficient documentation

## 2015-10-16 DIAGNOSIS — F17210 Nicotine dependence, cigarettes, uncomplicated: Secondary | ICD-10-CM | POA: Insufficient documentation

## 2015-10-16 DIAGNOSIS — I619 Nontraumatic intracerebral hemorrhage, unspecified: Secondary | ICD-10-CM | POA: Diagnosis not present

## 2015-10-16 DIAGNOSIS — I629 Nontraumatic intracranial hemorrhage, unspecified: Secondary | ICD-10-CM

## 2015-10-16 LAB — CBC WITH DIFFERENTIAL/PLATELET
BASOS PCT: 0 %
Basophils Absolute: 0 10*3/uL (ref 0–0.1)
Eosinophils Absolute: 0.2 10*3/uL (ref 0–0.7)
Eosinophils Relative: 1 %
HEMATOCRIT: 36.7 % (ref 35.0–47.0)
HEMOGLOBIN: 12.6 g/dL (ref 12.0–16.0)
Lymphocytes Relative: 7 %
Lymphs Abs: 1.3 10*3/uL (ref 1.0–3.6)
MCH: 30 pg (ref 26.0–34.0)
MCHC: 34.3 g/dL (ref 32.0–36.0)
MCV: 87.3 fL (ref 80.0–100.0)
MONOS PCT: 10 %
Monocytes Absolute: 1.7 10*3/uL — ABNORMAL HIGH (ref 0.2–0.9)
NEUTROS ABS: 14.6 10*3/uL — AB (ref 1.4–6.5)
NEUTROS PCT: 82 %
Platelets: 266 10*3/uL (ref 150–440)
RBC: 4.21 MIL/uL (ref 3.80–5.20)
RDW: 17.3 % — ABNORMAL HIGH (ref 11.5–14.5)
WBC: 17.8 10*3/uL — AB (ref 3.6–11.0)

## 2015-10-16 LAB — COMPREHENSIVE METABOLIC PANEL
ALBUMIN: 3.4 g/dL — AB (ref 3.5–5.0)
ALT: 20 U/L (ref 14–54)
AST: 22 U/L (ref 15–41)
Alkaline Phosphatase: 75 U/L (ref 38–126)
Anion gap: 12 (ref 5–15)
BILIRUBIN TOTAL: 0.6 mg/dL (ref 0.3–1.2)
CHLORIDE: 93 mmol/L — AB (ref 101–111)
CO2: 29 mmol/L (ref 22–32)
CREATININE: 0.54 mg/dL (ref 0.44–1.00)
Calcium: 9.3 mg/dL (ref 8.9–10.3)
GFR calc Af Amer: >60 mL/min (ref >60)
GLUCOSE: 149 mg/dL — AB (ref 65–99)
POTASSIUM: 2.8 mmol/L — AB (ref 3.5–5.1)
SODIUM: 134 mmol/L — AB (ref 135–145)
Total Protein: 6.5 g/dL (ref 6.5–8.1)

## 2015-10-16 LAB — PROTIME-INR
INR: 1.05
PROTHROMBIN TIME: 13.9 s (ref 11.4–15.0)

## 2015-10-16 LAB — APTT: aPTT: 27 seconds (ref 24–36)

## 2015-10-16 MED ORDER — HYDRALAZINE HCL 20 MG/ML IJ SOLN
10.0000 mg | Freq: Once | INTRAMUSCULAR | Status: AC
  Administered 2015-10-16: 10 mg via INTRAVENOUS
  Filled 2015-10-16: qty 1

## 2015-10-16 MED ORDER — ONDANSETRON HCL 4 MG/2ML IJ SOLN
INTRAMUSCULAR | Status: AC
Start: 2015-10-16 — End: 2015-10-16
  Administered 2015-10-16: 4 mg via INTRAVENOUS
  Filled 2015-10-16: qty 2

## 2015-10-16 MED ORDER — NICARDIPINE HCL IN NACL 20-0.86 MG/200ML-% IV SOLN
3.0000 mg/h | Freq: Once | INTRAVENOUS | Status: AC
  Administered 2015-10-16: 5 mg/h via INTRAVENOUS
  Filled 2015-10-16: qty 200

## 2015-10-16 MED ORDER — ONDANSETRON HCL 4 MG/2ML IJ SOLN
4.0000 mg | Freq: Once | INTRAMUSCULAR | Status: AC
  Administered 2015-10-16: 4 mg via INTRAVENOUS

## 2015-10-16 NOTE — ED Provider Notes (Signed)
[facility] Emergency Department Provider Note    ____________________________________________  Time seen: ~2300  I have reviewed the triage vital signs and the nursing notes.   HISTORY  Chief Complaint Headache   History limited by: Not Limited   HPI Samantha Cervantes is a 60 y.o. female who presents to the emergency department today because of headache. She states that it started this morning and was severe. It then got better after a Percocet. The headache returned this evening it was accompanied by some nausea and vomiting and left arm numbness. She states that the extremity numbness is now resolved. She states her headache has gotten a little better. She does state that she had a headache yesterday. She was discharged yesterday from the hospital after endarterectomy. The headache yesterday was similar to the headache today however resolved on its own and was not accompanied by any extremity numbness or weakness. She denies any trauma to her head. She was started on Plavix and took 1 dose today.    Past Medical History  Diagnosis Date  . COPD (chronic obstructive pulmonary disease) (HCC)   . Hypertension   . Allergy   . Heart murmur   . Peripheral vascular disease (HCC)   . Shortness of breath dyspnea   . Anxiety     Patient Active Problem List   Diagnosis Date Noted  . Carotid stenosis 10/14/2015  . Erythrocytosis 02/12/2015    Past Surgical History  Procedure Laterality Date  . Breast biopsy Left     neg  . Abdominal hysterectomy  1990  . Tubal ligation    . Tonsillectomy    . Cholecystectomy    . Endarterectomy Left 10/14/2015    Procedure: ENDARTERECTOMY CAROTID;  Surgeon: Annice Needy, MD;  Location: [facility] ORS;  Service: Vascular;  Laterality: Left;    Current Outpatient Rx  Name  Route  Sig  Dispense  Refill  . aspirin 81 MG EC tablet   Oral   Take 81 mg by mouth daily.          Marland Kitchen atorvastatin (LIPITOR) 40 MG tablet   Oral    Take 20 mg by mouth daily at 6 PM.          . clopidogrel (PLAVIX) 75 MG tablet   Oral   Take 1 tablet (75 mg total) by mouth daily with breakfast.   30 tablet   5   . enalapril (VASOTEC) 2.5 MG tablet      TAKE 1 TABLET (2.5 MG TOTAL) BY MOUTH ONCE DAILY.      99   . fluticasone (FLONASE) 50 MCG/ACT nasal spray   Each Nare   Place 2 sprays into both nostrils daily.         . hydrochlorothiazide (HYDRODIURIL) 25 MG tablet      TAKE 1 TABLET (25 MG TOTAL) BY MOUTH ONCE DAILY.      99   . oxyCODONE-acetaminophen (PERCOCET/ROXICET) 5-325 MG tablet   Oral   Take 1-2 tablets by mouth every 6 (six) hours as needed for severe pain.   30 tablet   0   . PROAIR HFA 108 (90 Base) MCG/ACT inhaler      2 puffs every 4 (four) hours as needed. for wheezing      11     Dispense as written.   Marland Kitchen SPIRIVA HANDIHALER 18 MCG inhalation capsule                 Dispense as  written.   Marland Kitchen buPROPion (WELLBUTRIN XL) 150 MG 24 hr tablet   Oral   Take 150 mg by mouth daily.            Allergies Review of patient's allergies indicates no known allergies.  Family History  Problem Relation Age of Onset  . Breast cancer Mother 1  . Stroke Father     Social History Social History  Substance Use Topics  . Smoking status: Current Some Day Smoker -- 0.50 packs/day for 30 years    Types: Cigarettes  . Smokeless tobacco: Never Used  . Alcohol Use: 4.2 oz/week    7 Glasses of wine per week    Review of Systems  Constitutional: Negative for fever. Cardiovascular: Negative for chest pain. Respiratory: Negative for shortness of breath. Gastrointestinal: Negative for abdominal pain, vomiting and diarrhea. Neurological: Positive for headache.   10-point ROS otherwise negative.  ____________________________________________   PHYSICAL EXAM:  VITAL SIGNS: ED Triage Vitals  Enc Vitals Group     BP 10/16/15 2234 185/86 mmHg     Pulse Rate 10/16/15 2234 90     Resp  10/16/15 2234 19     Temp 10/16/15 2234 97.6 F (36.4 C)     Temp Source 10/16/15 2234 Oral     SpO2 10/16/15 2234 97 %     Weight 10/16/15 2234 110 lb (49.896 kg)     Height 10/16/15 2234  (1.499 m)     Head Cir --      Peak Flow --      Pain Score 10/16/15 2234 8   Constitutional: Alert and oriented. Well appearing and in no distress. Eyes: Conjunctivae are normal. PERRL. Normal extraocular movements. ENT   Head: Normocephalic and atraumatic.   Nose: No congestion/rhinnorhea.   Mouth/Throat: Mucous membranes are moist.   Neck: No stridor. Hematological/Lymphatic/Immunilogical: No cervical lymphadenopathy. Cardiovascular: Normal rate, regular rhythm.  No murmurs, rubs, or gallops. Respiratory: Normal respiratory effort without tachypnea nor retractions. Breath sounds are clear and equal bilaterally. No wheezes/rales/rhonchi. Gastrointestinal: Soft and nontender. No distention. There is no CVA tenderness. Genitourinary: Deferred Musculoskeletal: Normal range of motion in all extremities. No joint effusions.  No lower extremity tenderness nor edema. Neurologic:  Normal speech and language. No gross focal neurologic deficits are appreciated.  Skin:  Skin is warm, dry and intact. No rash noted. Psychiatric: Mood and affect are normal. Speech and behavior are normal. Patient exhibits appropriate insight and judgment.  ____________________________________________    LABS (pertinent positives/negatives)  Labs Reviewed  CBC WITH DIFFERENTIAL/PLATELET - Abnormal; Notable for the following:    WBC 17.8 (*)    RDW 17.3 (*)    Neutro Abs 14.6 (*)    Monocytes Absolute 1.7 (*)    All other components within normal limits  COMPREHENSIVE METABOLIC PANEL - Abnormal; Notable for the following:    Sodium 134 (*)    Potassium 2.8 (*)    Chloride 93 (*)    Glucose, Bld 149 (*)    BUN <5 (*)    Albumin 3.4 (*)    All other components within normal limits  PROTIME-INR   APTT     ____________________________________________   EKG  None  ____________________________________________    RADIOLOGY  CT head IMPRESSION: Intracranial hemorrhage on the left with subdural, subarachnoid, and intraparenchymal components. There is 10 mm leftward midline shift and effacement of the left lateral ventricle.  I, Phineas Semen, personally viewed and evaluated these images as part of my medical decision  making. ____________________________________________   PROCEDURES  Procedure(s) performed: None  Critical Care performed: Yes, see critical care note(s)  CRITICAL CARE Performed by: Phineas SemenGOODMAN, Lachele Lievanos   Total critical care time: 40 minutes  Critical care time was exclusive of separately billable procedures and treating other patients.  Critical care was necessary to treat or prevent imminent or life-threatening deterioration.  Critical care was time spent personally by me on the following activities: development of treatment plan with patient and/or surrogate as well as nursing, discussions with consultants, evaluation of patient's response to treatment, examination of patient, obtaining history from patient or surrogate, ordering and performing treatments and interventions, ordering and review of laboratory studies, ordering and review of radiographic studies, pulse oximetry and re-evaluation of patient's condition.  ____________________________________________   INITIAL IMPRESSION / ASSESSMENT AND PLAN / ED COURSE  Pertinent labs & imaging results that were available during my care of the patient were reviewed by me and considered in my medical decision making (see chart for details).  Patient presented to the emergency department today because of headache. CT scan does show acute intercranial bleed. Patient is on Plavix. Patient was also hypertensive. Patient was given 10 mg hydralazine. [facility]  [facility].  [facility] Patient placed on nicardipine drip for blood pressure control prior to transfer.   ____________________________________________   FINAL CLINICAL IMPRESSION(S) / ED DIAGNOSES  Final diagnoses:  Intracranial bleed (HCC)     Phineas SemenGraydon Heela Heishman, MD 10/17/15 [id number]

## 2015-10-16 NOTE — Discharge Summary (Signed)
[facility]    Discharge Summary  Patient ID:  Samantha Cervantes MRN: [id number] DOB/AGE: 10/12/1955 60 y.o.  Admit date: 10/14/2015 Discharge date: 10/16/2015 Date of Surgery: 10/14/2015 Surgeon: Surgeon(s): Annice Needy, MD  Admission Diagnosis: CAROTID ARTERY STENOSIS  Discharge Diagnoses:  CAROTID ARTERY STENOSIS  Secondary Diagnoses: Past Medical History  Diagnosis Date  . COPD (chronic obstructive pulmonary disease) (HCC)   . Hypertension   . Allergy   . Heart murmur   . Peripheral vascular disease (HCC)   . Shortness of breath dyspnea   . Anxiety    Procedure(s): ENDARTERECTOMY CAROTID  Discharged Condition: good  HPI:  The patient is a 60 year old female with high grade near occlusive left carotid stenosis and Amarousis Fugax. The patient underwent a left carotid endarterectomy with CorMatrix arterial patch reconstruction on 10/14/15. She tolerated the procedure well and was transferred from the PACU to ICU without complication.  During her short stay, her foley was removed, diet was advanced, she was ambulating independently and her pain was controlled with PO medication.   Hospital Course:  Samantha Cervantes is a 60 y.o. female is S/P Left Procedure(s): ENDARTERECTOMY CAROTID  Extubated: POD # 0  Physical exam:  A&Ox3, NAD PULM:CTAB CV:RRR VASC: Neck C/D/I with minimal swelling NEURO: exam normal  Post-op wounds clean, dry, intact or healing well Pt. Ambulating, voiding and taking PO diet without difficulty. Pt pain controlled with PO pain meds.  Labs as below  Complications:none  Consults:   None  Significant Diagnostic Studies: CBC Lab Results  Component Value Date   WBC 11.5* 10/15/2015   HGB 13.3 10/15/2015   HCT 40.2 10/15/2015   MCV 90.6 10/15/2015   PLT 261 10/15/2015   BMET    Component Value Date/Time   NA 135 10/15/2015 0357   K 4.0 10/15/2015 0357   CL 100* 10/15/2015 0357   CO2 27 10/15/2015 0357    GLUCOSE 112* 10/15/2015 0357   BUN 10 10/15/2015 0357   CREATININE 0.73 10/15/2015 0357   CALCIUM 8.4* 10/15/2015 0357   GFRNONAA >60 10/15/2015 0357   GFRAA >60 10/15/2015 0357   COAG Lab Results  Component Value Date   INR 0.96 10/07/2015   Disposition:  Discharge to :Home    Medication List    TAKE these medications        aspirin 81 MG EC tablet  Take 81 mg by mouth daily.     atorvastatin 40 MG tablet  Commonly known as:  LIPITOR  Take 20 mg by mouth daily at 6 PM.     buPROPion 150 MG 24 hr tablet  Commonly known as:  WELLBUTRIN XL  Take 150 mg by mouth daily.     clopidogrel 75 MG tablet  Commonly known as:  PLAVIX  Take 1 tablet (75 mg total) by mouth daily with breakfast.     enalapril 2.5 MG tablet  Commonly known as:  VASOTEC  TAKE 1 TABLET (2.5 MG TOTAL) BY MOUTH ONCE DAILY.     fluticasone 50 MCG/ACT nasal spray  Commonly known as:  FLONASE  Place 2 sprays into both nostrils daily.     hydrochlorothiazide 25 MG tablet  Commonly known as:  HYDRODIURIL  TAKE 1 TABLET (25 MG TOTAL) BY MOUTH ONCE DAILY.     oxyCODONE-acetaminophen 5-325 MG tablet  Commonly known as:  PERCOCET/ROXICET  Take 1-2 tablets by mouth every 6 (six) hours as needed for severe pain.  PROAIR HFA 108 (90 Base) MCG/ACT inhaler  Generic drug:  albuterol  2 puffs every 4 (four) hours as needed. for wheezing     SPIRIVA HANDIHALER 18 MCG inhalation capsule  Generic drug:  tiotropium       Verbal and written Discharge instructions given to the patient. Wound care per Discharge AVS     Follow-up Information    Follow up with DEW,JASON, MD In 3 weeks.   Specialties:  Vascular Surgery, Radiology, Interventional Cardiology   Why:  Bilateral Carotid Ultrasound   Contact information:   [address] [place] [address] [phone]       [facility] In 2 weeks.   Specialty:  Family Medicine   Contact information:   [address] [place] [address] [phone]      Signed: Tonette LedererKIMBERLY A Latavion Halls, PA-C  10/16/2015, 1:43 PM

## 2015-10-16 NOTE — ED Notes (Signed)
Pt reports having left carotid vascular surgery on Thursday, today developed a HA unrelieved by percocet, worse over last 2 hours.  Rash noted to pt's forehead.  PT reports pain is located left side of head and behind left eye, some drooping noted to left eye.  Smile symmetrical, equal grip strength PERRL, speech normal, no slurring.  PT NAD at this time.

## 2015-10-17 NOTE — ED Notes (Signed)
Pt assisted to toilet for continent void.  Pt ambulated without difficulty

## 2015-10-18 LAB — SURGICAL PATHOLOGY

## 2015-12-06 DEATH — deceased

## 2015-12-30 ENCOUNTER — Encounter: Payer: Self-pay | Admitting: Vascular Surgery

## 2016-07-14 IMAGING — CT CT ANGIO NECK
2 of 3 series · 8 of 14 positions shown · IV contrast (APPLIED)
Comparison: Carotid duplex sonogram 10/29/2014.

CLINICAL DATA: Patient complains of a floater in the LEFT eye.

EXAM:
CT ANGIOGRAPHY NECK
TECHNIQUE: Multidetector CT imaging of the neck was performed using the
standard protocol during bolus administration of intravenous
contrast. Multiplanar CT image reconstructions and MIPs were
obtained to evaluate the vascular anatomy. Carotid stenosis
measurements (when applicable) are obtained utilizing NASCET
criteria, using the distal internal carotid diameter as the
denominator.
CONTRAST:  75mL OMNIPAQUE IOHEXOL 350 MG/ML SOLN

[Series 4: cta neck · axial · 0.39mm/px · z∈[-177,-95]mm · 2 of 125 slices shown]
[im 42/125  soft-tissue]
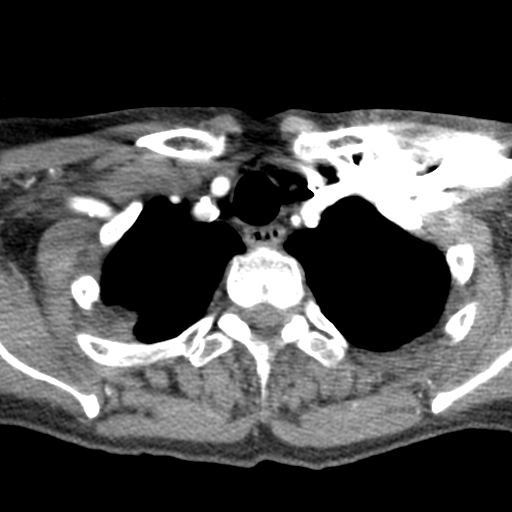
[im 83/125  soft-tissue]
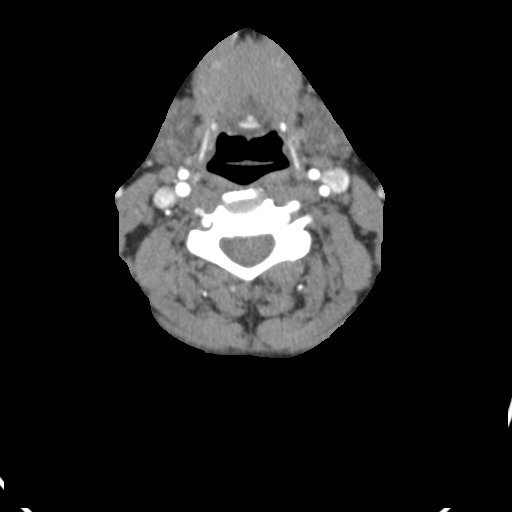

[Series 6: ax thin · axial · 0.30mm/px · z∈[-227,-50]mm · 6 of 249 slices shown]
[im 36/249  soft-tissue]
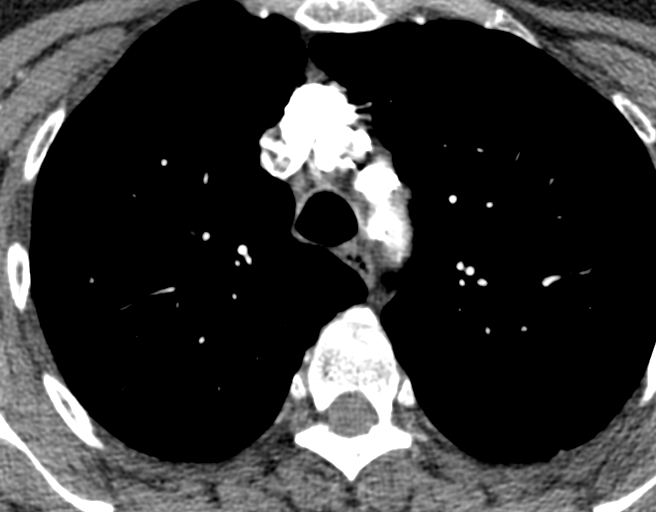
[im 71/249  bone]
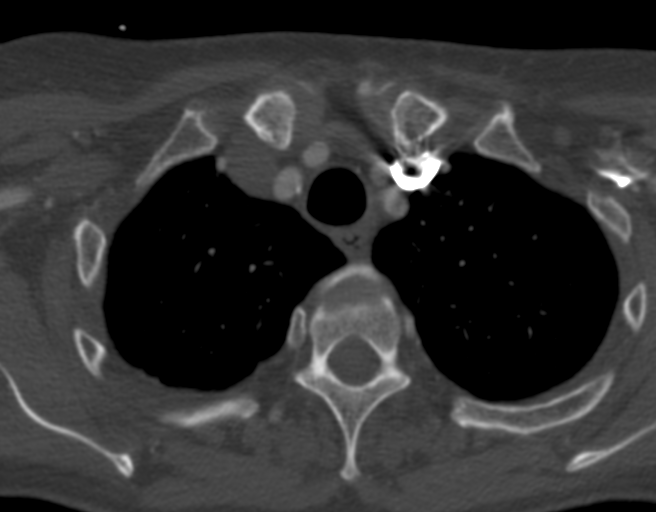
[im 107/249  soft-tissue]
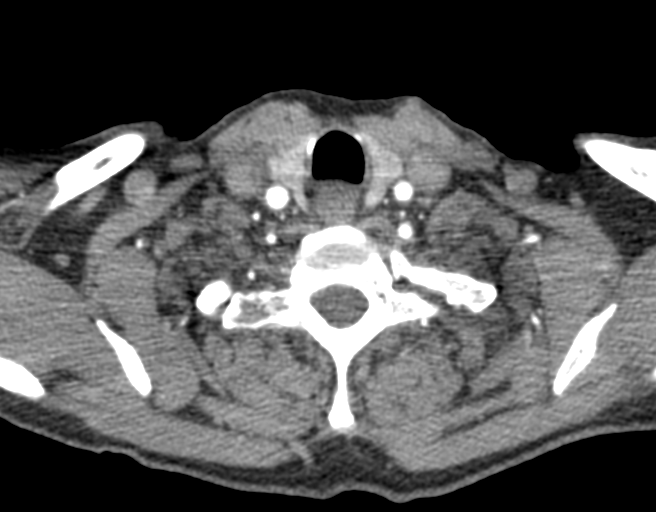
[im 142/249  bone]
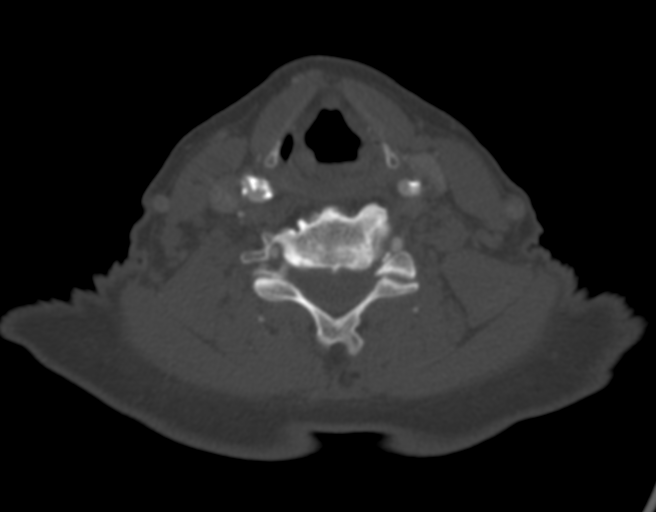
[im 178/249  soft-tissue]
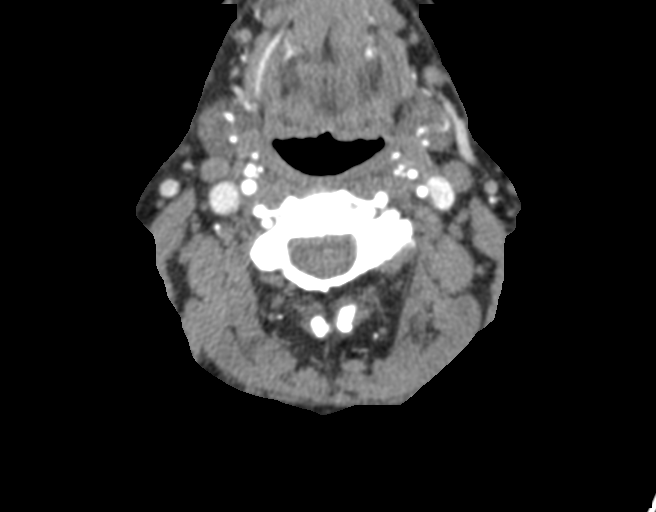
[im 213/249  bone]
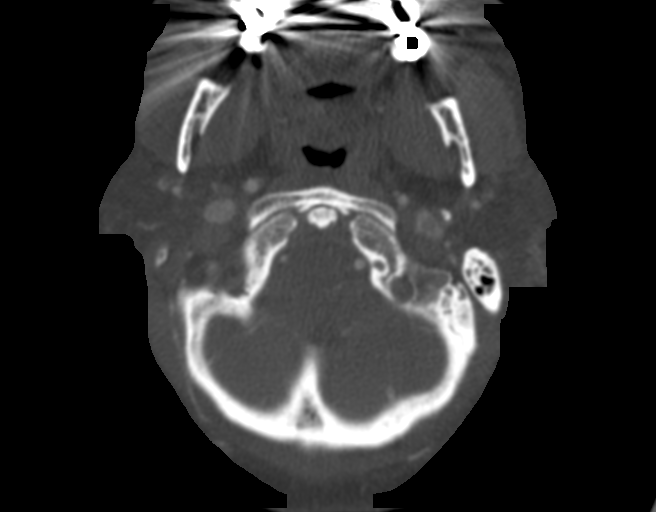

[8 of 14 positions shown; findings below may reference images not displayed]

FINDINGS: Aortic arch: Standard branching. Imaged portion shows no evidence of
aneurysm or dissection. Heavily calcified plaque at the innominate
origin potentially flow reducing, 50% or greater stenosis. Non
stenotic atherosclerotic calcification at the origin LEFT carotid,
LEFT subclavian, RIGHT carotid, and RIGHT subclavian.

Right carotid system: 60% ICA stenosis, based on luminal
measurements of 1.6/3.9 proximal/ distal. Heavily calcified without
soft plaque. No evidence of dissection, or occlusion.

Left carotid system: 60% ICA stenosis, based on luminal measurements
1.3/3.3 proximal/ distal. The stenosis could be greater, given the
relatively small distal LEFT ICA if there is a degree of flow
diminution. No evidence of dissection, or occlusion.

Vertebral arteries: LEFT dominant. Mild calcific plaque both with
the origin. No evidence of dissection, stenosis (50% or greater) or
occlusion.

Other: Spondylosis. Biapical pleural thickening with emphysematous
change in the lungs. No neck masses. No pathologic adenopathy.
Negative visualized intracranial compartment.
IMPRESSION: BILATERAL 60% ICA stenoses, with heavily calcified plaque at the
carotid bifurcations. No soft plaque is evident.

Heavily calcified plaque at the innominate origin, potentially flow
reducing also.

Dominant LEFT vertebral without flow reducing lesion in the
posterior circulation.

## 2016-09-27 ENCOUNTER — Inpatient Hospital Stay: Payer: Self-pay | Admitting: Internal Medicine

## 2016-09-27 ENCOUNTER — Other Ambulatory Visit: Payer: BC Managed Care – PPO

## 2016-09-27 ENCOUNTER — Ambulatory Visit: Payer: BC Managed Care – PPO | Admitting: Internal Medicine

## 2016-09-27 ENCOUNTER — Inpatient Hospital Stay: Payer: Self-pay
